# Patient Record
Sex: Female | Born: 1995 | Race: Black or African American | Hispanic: No | Marital: Single | Smoking: Current every day smoker
Health system: Southern US, Community
[De-identification: ages and names within clinical notes are randomized; demographics above are authoritative.]

## PROBLEM LIST (undated history)

## (undated) HISTORY — PX: WISDOM TOOTH EXTRACTION: SHX21

---
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
## 2016-05-14 ENCOUNTER — Encounter: Payer: Self-pay | Admitting: Emergency Medicine

## 2016-05-14 ENCOUNTER — Emergency Department
Admission: EM | Admit: 2016-05-14 | Discharge: 2016-05-14 | Disposition: A | Discharge status: Home / Self Care | Payer: Self-pay | Attending: Emergency Medicine | Admitting: Emergency Medicine

## 2016-05-14 DIAGNOSIS — F17210 Nicotine dependence, cigarettes, uncomplicated: Secondary | ICD-10-CM | POA: Insufficient documentation

## 2016-05-14 DIAGNOSIS — Z5321 Procedure and treatment not carried out due to patient leaving prior to being seen by health care provider: Secondary | ICD-10-CM | POA: Insufficient documentation

## 2016-05-14 DIAGNOSIS — R110 Nausea: Secondary | ICD-10-CM | POA: Insufficient documentation

## 2016-05-14 LAB — URINALYSIS COMPLETE WITH MICROSCOPIC (ARMC ONLY)
BILIRUBIN URINE: NEGATIVE
Glucose, UA: NEGATIVE mg/dL
Ketones, ur: NEGATIVE mg/dL
Nitrite: NEGATIVE
PH: 6 (ref 5.0–8.0)
Protein, ur: NEGATIVE mg/dL
Specific Gravity, Urine: 1.029 (ref 1.005–1.030)

## 2016-05-14 LAB — COMPREHENSIVE METABOLIC PANEL
ALT: 21 U/L (ref 14–54)
ANION GAP: 7 (ref 5–15)
AST: 18 U/L (ref 15–41)
Albumin: 4 g/dL (ref 3.5–5.0)
Alkaline Phosphatase: 76 U/L (ref 38–126)
BUN: 13 mg/dL (ref 6–20)
CHLORIDE: 108 mmol/L (ref 101–111)
CO2: 24 mmol/L (ref 22–32)
Calcium: 8.9 mg/dL (ref 8.9–10.3)
Creatinine, Ser: 0.67 mg/dL (ref 0.44–1.00)
GFR calc Af Amer: >60 mL/min (ref >60)
Glucose, Bld: 130 mg/dL — ABNORMAL HIGH (ref 65–99)
POTASSIUM: 3.8 mmol/L (ref 3.5–5.1)
Sodium: 139 mmol/L (ref 135–145)
Total Bilirubin: 0.8 mg/dL (ref 0.3–1.2)
Total Protein: 7.5 g/dL (ref 6.5–8.1)

## 2016-05-14 LAB — CBC
HEMATOCRIT: 40.8 % (ref 35.0–47.0)
HEMOGLOBIN: 13.5 g/dL (ref 12.0–16.0)
MCH: 27 pg (ref 26.0–34.0)
MCHC: 33 g/dL (ref 32.0–36.0)
MCV: 81.7 fL (ref 80.0–100.0)
PLATELETS: 205 10*3/uL (ref 150–440)
RBC: 4.99 MIL/uL (ref 3.80–5.20)
RDW: 14.4 % (ref 11.5–14.5)
WBC: 9.8 10*3/uL (ref 3.6–11.0)

## 2016-05-14 LAB — PREGNANCY, URINE: Preg Test, Ur: NEGATIVE

## 2016-05-14 LAB — LIPASE, BLOOD: LIPASE: 18 U/L (ref 11–51)

## 2016-05-14 NOTE — ED Notes (Signed)
Room open awaiting pt return from registration att

## 2016-05-14 NOTE — ED Notes (Signed)
Per sabra at registration, pt wishes to be seen and is returning to room.

## 2016-05-14 NOTE — ED Notes (Signed)
C/O nausea x 2 days.   1 episode of diarrhea today.  Denies c/o abdominal pain or dysuria.

## 2016-05-14 NOTE — ED Notes (Signed)
Called to HalfwayBrandy, first RN, for pt status, still unknown att

## 2016-05-14 NOTE — ED Notes (Signed)
Stat desk called, pt eloped d/t "going to a meeting", pt requesting a work note att, Benedetto Goadndrrea, charge, called and denial of request confirmed, "work note is with discharge instructions upon request"

## 2016-05-14 NOTE — ED Notes (Signed)
Urine specimen already in lab, did not obtain POC prior to sending to lab.

## 2016-05-14 NOTE — ED Notes (Addendum)
Pt states she fainted yesterday. States she thinks it was because she hadn't had anything to eat. Pt states her mother got her to eat but she continued to feel nauseous.  Pt stated she had fainted once before this episode and thought it was because she had gotten too hot.

## 2016-05-14 NOTE — ED Notes (Signed)
Per stat registration patient stated she needed to go to a meeting.

## 2017-01-16 ENCOUNTER — Emergency Department
Admission: EM | Admit: 2017-01-16 | Discharge: 2017-01-16 | Disposition: A | Discharge status: Home / Self Care | Payer: No Typology Code available for payment source | Attending: Emergency Medicine | Admitting: Emergency Medicine

## 2017-01-16 ENCOUNTER — Encounter: Payer: Self-pay | Admitting: Intensive Care

## 2017-01-16 DIAGNOSIS — H10212 Acute toxic conjunctivitis, left eye: Secondary | ICD-10-CM | POA: Diagnosis not present

## 2017-01-16 DIAGNOSIS — F17210 Nicotine dependence, cigarettes, uncomplicated: Secondary | ICD-10-CM | POA: Insufficient documentation

## 2017-01-16 DIAGNOSIS — T59811A Toxic effect of smoke, accidental (unintentional), initial encounter: Secondary | ICD-10-CM | POA: Insufficient documentation

## 2017-01-16 DIAGNOSIS — Y999 Unspecified external cause status: Secondary | ICD-10-CM | POA: Insufficient documentation

## 2017-01-16 DIAGNOSIS — S0592XA Unspecified injury of left eye and orbit, initial encounter: Secondary | ICD-10-CM | POA: Diagnosis present

## 2017-01-16 DIAGNOSIS — Y9389 Activity, other specified: Secondary | ICD-10-CM | POA: Insufficient documentation

## 2017-01-16 DIAGNOSIS — Y92410 Unspecified street and highway as the place of occurrence of the external cause: Secondary | ICD-10-CM | POA: Diagnosis not present

## 2017-01-16 MED ORDER — KETOROLAC TROMETHAMINE 0.5 % OP SOLN: 1.0000 [drp] | Freq: Four times a day (QID) | OPHTHALMIC | 0 refills | Status: AC
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
## 2017-05-15 DIAGNOSIS — S161XXA Strain of muscle, fascia and tendon at neck level, initial encounter: Secondary | ICD-10-CM | POA: Insufficient documentation

## 2017-05-15 DIAGNOSIS — M791 Myalgia: Secondary | ICD-10-CM | POA: Diagnosis not present

## 2017-05-15 DIAGNOSIS — Y92410 Unspecified street and highway as the place of occurrence of the external cause: Secondary | ICD-10-CM | POA: Diagnosis not present

## 2017-05-15 DIAGNOSIS — S199XXA Unspecified injury of neck, initial encounter: Secondary | ICD-10-CM | POA: Diagnosis present

## 2017-05-15 DIAGNOSIS — Y999 Unspecified external cause status: Secondary | ICD-10-CM | POA: Diagnosis not present

## 2017-05-15 DIAGNOSIS — Y939 Activity, unspecified: Secondary | ICD-10-CM | POA: Diagnosis not present

## 2017-05-15 DIAGNOSIS — F17210 Nicotine dependence, cigarettes, uncomplicated: Secondary | ICD-10-CM | POA: Insufficient documentation

## 2017-05-16 ENCOUNTER — Emergency Department
Admission: EM | Admit: 2017-05-16 | Discharge: 2017-05-16 | Disposition: A | Discharge status: Home / Self Care | Payer: No Typology Code available for payment source | Attending: Emergency Medicine | Admitting: Emergency Medicine

## 2017-05-16 ENCOUNTER — Encounter: Payer: Self-pay | Admitting: Emergency Medicine

## 2017-05-16 ENCOUNTER — Emergency Department: Payer: No Typology Code available for payment source

## 2017-05-16 DIAGNOSIS — S161XXA Strain of muscle, fascia and tendon at neck level, initial encounter: Secondary | ICD-10-CM

## 2017-05-16 DIAGNOSIS — M7918 Myalgia, other site: Secondary | ICD-10-CM

## 2017-05-16 MED ORDER — IBUPROFEN 600 MG PO TABS
600.0000 mg | ORAL_TABLET | Freq: Once | ORAL | Status: AC
  Administered 2017-05-16: 600 mg via ORAL
  Filled 2017-05-16: qty 1

## 2017-05-16 MED ORDER — ETODOLAC 200 MG PO CAPS: 200.0000 mg | ORAL_CAPSULE | Freq: Three times a day (TID) | ORAL | 0 refills | Status: AC
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
## 2024-02-12 ENCOUNTER — Encounter (HOSPITAL_COMMUNITY): Payer: Self-pay

## 2024-02-12 ENCOUNTER — Emergency Department (HOSPITAL_COMMUNITY)
Admission: EM | Admit: 2024-02-12 | Discharge: 2024-02-12 | Disposition: A | Discharge status: Home / Self Care | Payer: BC Managed Care – PPO | Attending: Emergency Medicine | Admitting: Emergency Medicine

## 2024-02-12 ENCOUNTER — Other Ambulatory Visit: Payer: Self-pay

## 2024-02-12 ENCOUNTER — Other Ambulatory Visit (HOSPITAL_COMMUNITY): Payer: Self-pay

## 2024-02-12 DIAGNOSIS — B009 Herpesviral infection, unspecified: Secondary | ICD-10-CM | POA: Diagnosis not present

## 2024-02-12 DIAGNOSIS — R21 Rash and other nonspecific skin eruption: Secondary | ICD-10-CM | POA: Diagnosis present

## 2024-02-12 LAB — WET PREP, GENITAL
Clue Cells Wet Prep HPF POC: NONE SEEN
Sperm: NONE SEEN
Trich, Wet Prep: NONE SEEN
WBC, Wet Prep HPF POC: <10 (ref <10)
Yeast Wet Prep HPF POC: NONE SEEN

## 2024-02-12 LAB — GC/CHLAMYDIA PROBE AMP (~~LOC~~) NOT AT ARMC
Chlamydia: NEGATIVE
Comment: NEGATIVE
Comment: NORMAL
Neisseria Gonorrhea: NEGATIVE

## 2024-02-12 LAB — RPR: RPR Ser Ql: NONREACTIVE

## 2024-02-12 LAB — HIV ANTIBODY (ROUTINE TESTING W REFLEX): HIV Screen 4th Generation wRfx: NONREACTIVE

## 2024-02-12 MED ORDER — LIDOCAINE HCL (PF) 1 % IJ SOLN
INTRAMUSCULAR | Status: AC
  Filled 2024-02-12: qty 5

## 2024-02-12 MED ORDER — DOXYCYCLINE HYCLATE 100 MG PO TABS
100.0000 mg | ORAL_TABLET | Freq: Once | ORAL | Status: AC
Start: 2024-02-12 — End: 2024-02-12
  Administered 2024-02-12: 100 mg via ORAL
  Filled 2024-02-12: qty 1

## 2024-02-12 MED ORDER — VALACYCLOVIR HCL 500 MG PO TABS
1000.0000 mg | ORAL_TABLET | Freq: Once | ORAL | Status: AC
  Administered 2024-02-12: 1000 mg via ORAL
  Filled 2024-02-12: qty 2

## 2024-02-12 MED ORDER — CEFTRIAXONE SODIUM 500 MG IJ SOLR
500.0000 mg | Freq: Once | INTRAMUSCULAR | Status: AC
  Administered 2024-02-12: 500 mg via INTRAMUSCULAR
  Filled 2024-02-12: qty 500

## 2024-02-12 MED ORDER — VALACYCLOVIR HCL 1 G PO TABS
1000.0000 mg | ORAL_TABLET | Freq: Two times a day (BID) | ORAL | 0 refills | Status: AC
  Filled 2024-02-12: qty 20, 10d supply, fill #0

## 2024-02-12 MED ORDER — HYDROCODONE-ACETAMINOPHEN 5-325 MG PO TABS
1.0000 | ORAL_TABLET | Freq: Once | ORAL | Status: AC
  Administered 2024-02-12: 1 via ORAL
  Filled 2024-02-12: qty 1

## 2024-02-12 MED ORDER — DOXYCYCLINE HYCLATE 100 MG PO CAPS
100.0000 mg | ORAL_CAPSULE | Freq: Two times a day (BID) | ORAL | 0 refills | Status: AC
  Filled 2024-02-12: qty 20, 10d supply, fill #0

## 2024-02-12 NOTE — ED Provider Notes (Signed)
[place] EMERGENCY DEPARTMENT AT [facility] Provider Note   CSN: [id number] Arrival date & time: 02/12/24  4098     History  Chief Complaint  Patient presents with   Rash    Denise Rich is a 28 y.o. female.  28 year old female here today for painful sores in her groin, around her buttocks.  Symptoms have been ongoing for several days, which they are itchy and are now painful.  Patient does endorse recent sexual intercourse.   Rash      Home Medications Prior to Admission medications   Medication Sig Start Date End Date Taking? Authorizing Provider  doxycycline (VIBRAMYCIN) 100 MG capsule Take 1 capsule (100 mg total) by mouth 2 (two) times daily. 02/12/24  Yes Arletha Pili, DO  valACYclovir (VALTREX) 1000 MG tablet Take 1 tablet (1,000 mg total) by mouth 2 (two) times daily for 10 days. 02/12/24 02/22/24 Yes Anders Simmonds T, DO  etodolac (LODINE) 200 MG capsule Take 1 capsule (200 mg total) by mouth every 8 (eight) hours. 05/16/17   Rebecka Apley, MD  ketorolac (ACULAR) 0.5 % ophthalmic solution Place 1 drop into the right eye 4 (four) times daily. 01/16/17   Cuthriell, Delorise Royals, PA-C      Allergies    Patient has no known allergies.    Review of Systems   Review of Systems  Skin:  Positive for rash.    Physical Exam Updated Vital Signs BP 139/81 (BP Location: Right Arm)   Pulse 76   Temp 98 F (36.7 C) (Oral)   Resp 20   Ht 5\' 4"  (1.626 m)   Wt 124.7 kg   LMP 01/26/2024 (Approximate)   SpO2 100%   BMI 47.20 kg/m  Physical Exam Vitals reviewed.  Genitourinary:    Comments: Female staff chaperone present.  Sores surrounding the genitourinary and rectal region consistent with HSV.  No crepitus. Neurological:     Mental Status: She is alert.     ED Results / Procedures / Treatments   Labs (all labs ordered are listed, but only abnormal results are displayed) Labs Reviewed  WET PREP, GENITAL  HIV ANTIBODY (ROUTINE TESTING W  REFLEX)  RPR  GC/CHLAMYDIA PROBE AMP ([place]) NOT AT [facility]    EKG None  Radiology No results found.  Procedures Procedures    Medications Ordered in ED Medications  HYDROcodone-acetaminophen (NORCO/VICODIN) 5-325 MG per tablet 1 tablet (1 tablet Oral Given 02/12/24 0848)  cefTRIAXone (ROCEPHIN) injection 500 mg (500 mg Intramuscular Given 02/12/24 0902)  doxycycline (VIBRA-TABS) tablet 100 mg (100 mg Oral Given 02/12/24 0848)  valACYclovir (VALTREX) tablet 1,000 mg (1,000 mg Oral Given 02/12/24 0903)  lidocaine (PF) (XYLOCAINE) 1 % injection (  Given 02/12/24 0902)    ED Course/ Medical Decision Making/ A&P                                 Medical Decision Making 27 year old female here today for painful sores in the groin around the buttocks.  Differential diagnosis include HSV, additional STI  Plan-patient's exam is consistent with HSV.  She says that she went to an urgent care and they did cultures that were negative.  This is HSV, will test for syphilis, HIV.  Will have patient self swab wet prep.  Reassessment 9:50 AM-no clue cells on wet prep.  Empirically treating the patient for gonorrhea chlamydia.  Will discharge with prescriptions for valacyclovir, doxycycline.  Amount and/or Complexity of Data Reviewed Labs: ordered.  Risk Prescription drug management.           Final Clinical Impression(s) / ED Diagnoses Final diagnoses:  Herpes simplex    Rx / DC Orders ED Discharge Orders          Ordered    valACYclovir (VALTREX) 1000 MG tablet  2 times daily        02/12/24 0819    doxycycline (VIBRAMYCIN) 100 MG capsule  2 times daily        02/12/24 0819              Anders Simmonds T, DO 02/12/24 [phone]

## 2024-02-12 NOTE — ED Triage Notes (Signed)
Pt states she had bumps on groind and buttocks that were itching. Pt states she went UC and culture was negative. Pt states her herpes test came back negative and now bumps have turned into open sores. Pt states they are painful now.

## 2024-02-12 NOTE — Discharge Instructions (Addendum)
You can take valacyclovir 2 times per day for the next 10 days.  This will help with the current outbreak that you have.  Fortunately, there is not a ton that we can do for the pain.  Motrin and Tylenol can be helpful, but the virus has to run its course.  I am also empirically treating you for gonorrhea and chlamydia with an antibiotic called doxycycline.  You can take it once in the morning once in the evening for the next 10 days.  You will be contacted if your HIV, syphilis, gonorrhea or chlamydia tests are positive.
